# Patient Record
Sex: Female | Born: 1970 | Hispanic: Yes | Marital: Married | Smoking: Never smoker
Health system: Southern US, Community
[De-identification: ages and names within clinical notes are randomized; demographics above are authoritative.]

## PROBLEM LIST (undated history)

## (undated) DIAGNOSIS — D649 Anemia, unspecified: Secondary | ICD-10-CM

## (undated) HISTORY — PX: TUBAL LIGATION: SHX77

---

## 2014-05-22 ENCOUNTER — Ambulatory Visit: Payer: Self-pay | Admitting: Internal Medicine

## 2014-05-22 LAB — COMPREHENSIVE METABOLIC PANEL
Albumin: 3.5 g/dL (ref 3.4–5.0)
Alkaline Phosphatase: 93 U/L
Anion Gap: 8 (ref 7–16)
BILIRUBIN TOTAL: 0.4 mg/dL (ref 0.2–1.0)
BUN: 13 mg/dL (ref 7–18)
CALCIUM: 8.6 mg/dL (ref 8.5–10.1)
Chloride: 105 mmol/L (ref 98–107)
Co2: 27 mmol/L (ref 21–32)
Creatinine: 0.72 mg/dL (ref 0.60–1.30)
EGFR (Non-African Amer.): >60
Glucose: 108 mg/dL — ABNORMAL HIGH (ref 65–99)
Osmolality: 280 (ref 275–301)
POTASSIUM: 3.9 mmol/L (ref 3.5–5.1)
SGOT(AST): 19 U/L (ref 15–37)
SGPT (ALT): 19 U/L
Sodium: 140 mmol/L (ref 136–145)
Total Protein: 7.2 g/dL (ref 6.4–8.2)

## 2014-05-22 LAB — PROTIME-INR
INR: 1
Prothrombin Time: 12.9 secs (ref 11.5–14.7)

## 2014-05-22 LAB — CBC WITH DIFFERENTIAL/PLATELET
BASOS ABS: 0.1 10*3/uL (ref 0.0–0.1)
Basophil %: 0.8 %
EOS ABS: 0.1 10*3/uL (ref 0.0–0.7)
Eosinophil %: 1.4 %
HCT: 31.8 % — ABNORMAL LOW (ref 35.0–47.0)
HGB: 9.7 g/dL — AB (ref 12.0–16.0)
Lymphocyte #: 2.8 10*3/uL (ref 1.0–3.6)
Lymphocyte %: 32.8 %
MCH: 21.8 pg — AB (ref 26.0–34.0)
MCHC: 30.5 g/dL — ABNORMAL LOW (ref 32.0–36.0)
MCV: 72 fL — AB (ref 80–100)
MONO ABS: 0.7 x10 3/mm (ref 0.2–0.9)
Monocyte %: 7.8 %
NEUTROS ABS: 4.9 10*3/uL (ref 1.4–6.5)
NEUTROS PCT: 57.2 %
Platelet: 366 10*3/uL (ref 150–440)
RBC: 4.45 10*6/uL (ref 3.80–5.20)
RDW: 17.2 % — AB (ref 11.5–14.5)
WBC: 8.6 10*3/uL (ref 3.6–11.0)

## 2014-05-22 LAB — APTT: Activated PTT: 26.5 secs (ref 23.6–35.9)

## 2014-05-22 LAB — TSH: Thyroid Stimulating Horm: 2.84 u[IU]/mL

## 2019-03-28 ENCOUNTER — Other Ambulatory Visit: Payer: Self-pay

## 2019-03-28 ENCOUNTER — Emergency Department
Admission: EM | Admit: 2019-03-28 | Discharge: 2019-03-28 | Disposition: A | Discharge status: Home / Self Care | Attending: Emergency Medicine | Admitting: Emergency Medicine

## 2019-03-28 ENCOUNTER — Emergency Department

## 2019-03-28 ENCOUNTER — Encounter: Payer: Self-pay | Admitting: Emergency Medicine

## 2019-03-28 DIAGNOSIS — Y9389 Activity, other specified: Secondary | ICD-10-CM | POA: Diagnosis not present

## 2019-03-28 DIAGNOSIS — Y9289 Other specified places as the place of occurrence of the external cause: Secondary | ICD-10-CM | POA: Insufficient documentation

## 2019-03-28 DIAGNOSIS — W270XXA Contact with workbench tool, initial encounter: Secondary | ICD-10-CM | POA: Diagnosis not present

## 2019-03-28 DIAGNOSIS — S61012A Laceration without foreign body of left thumb without damage to nail, initial encounter: Secondary | ICD-10-CM | POA: Diagnosis present

## 2019-03-28 DIAGNOSIS — S62522A Displaced fracture of distal phalanx of left thumb, initial encounter for closed fracture: Secondary | ICD-10-CM | POA: Insufficient documentation

## 2019-03-28 DIAGNOSIS — Y990 Civilian activity done for income or pay: Secondary | ICD-10-CM | POA: Diagnosis not present

## 2019-03-28 MED ORDER — NAPROXEN 500 MG PO TABS: 500.0000 mg | ORAL_TABLET | Freq: Two times a day (BID) | ORAL | Status: DC

## 2019-03-28 MED ORDER — BACITRACIN-NEOMYCIN-POLYMYXIN 400-5-5000 EX OINT
TOPICAL_OINTMENT | Freq: Once | CUTANEOUS | Status: AC
  Administered 2019-03-28: 1 via TOPICAL
  Filled 2019-03-28: qty 1

## 2019-03-28 MED ORDER — SULFAMETHOXAZOLE-TRIMETHOPRIM 800-160 MG PO TABS: 1.0000 | ORAL_TABLET | Freq: Two times a day (BID) | ORAL | 0 refills | Status: DC

## 2019-03-28 MED ORDER — LIDOCAINE HCL (PF) 1 % IJ SOLN
INTRAMUSCULAR | Status: AC
  Administered 2019-03-28: 5 mL
  Filled 2019-03-28: qty 5

## 2019-03-28 MED ORDER — LIDOCAINE HCL (PF) 1 % IJ SOLN
5.0000 mL | Freq: Once | INTRAMUSCULAR | Status: AC
  Administered 2019-03-28: 09:00:00 5 mL

## 2019-03-28 MED ORDER — OXYCODONE-ACETAMINOPHEN 5-325 MG PO TABS: 1.0000 | ORAL_TABLET | Freq: Once | ORAL | Status: DC

## 2019-03-28 MED ORDER — OXYCODONE-ACETAMINOPHEN 7.5-325 MG PO TABS: 1.0000 | ORAL_TABLET | Freq: Four times a day (QID) | ORAL | 0 refills | Status: DC | PRN

## 2019-03-28 MED ORDER — IBUPROFEN 600 MG PO TABS: 600.0000 mg | ORAL_TABLET | Freq: Once | ORAL | Status: DC

## 2019-03-28 NOTE — ED Triage Notes (Signed)
Pt here with c/o hitting the first digit on her left hand this morning with a hammer while at work at [employer], is workers comp case. NAD.

## 2019-03-28 NOTE — ED Provider Notes (Signed)
[facility] Emergency Department Provider Note   ____________________________________________   None    (approximate)  I have reviewed the triage vital signs and the nursing notes.   HISTORY  Chief Complaint Hand Pain    HPI: Via interpreter Tracy Donovan is a 48 y.o. female patient presents with left thumb pain secondary to contusion.  Patient that she hit her thumb with a hammer while at work at [employer].  Patient denies loss of sensation or loss of function.  Patient is right-hand dominant.  Patient rates the pain as a 5/10.  Patient described the pain is "achy".  Pressure dressing applied prior to arrival.         History reviewed. No pertinent past medical history.  There are no active problems to display for this patient.   History reviewed. No pertinent surgical history.  Prior to Admission medications   Medication Sig Start Date End Date Taking? Authorizing Provider  naproxen (NAPROSYN) 500 MG tablet Take 1 tablet (500 mg total) by mouth 2 (two) times daily with a meal. 03/28/19   Sable Feil, PA-C  oxyCODONE-acetaminophen (PERCOCET) 7.5-325 MG tablet Take 1 tablet by mouth every 6 (six) hours as needed. 03/28/19   Sable Feil, PA-C  sulfamethoxazole-trimethoprim (BACTRIM DS) 800-160 MG tablet Take 1 tablet by mouth 2 (two) times daily. 03/28/19   Sable Feil, PA-C    Allergies Patient has no known allergies.  No family history on file.  Social History Social History   Tobacco Use  . Smoking status: Never Smoker  . Smokeless tobacco: Never Used  Substance Use Topics  . Alcohol use: Not Currently  . Drug use: Not on file    Review of Systems Constitutional: No fever/chills Eyes: No visual changes. ENT: No sore throat. Cardiovascular: Denies chest pain. Respiratory: Denies shortness of breath. Gastrointestinal: No abdominal pain.  No nausea, no vomiting.  No diarrhea.  No constipation. Genitourinary:  Negative for dysuria. Musculoskeletal: Pain edema left thumb Skin: Negative for rash. Neurological: Negative for headaches, focal weakness or numbness.   ____________________________________________   PHYSICAL EXAM:  VITAL SIGNS: ED Triage Vitals  Enc Vitals Group     BP 03/28/19 0819 (!) 188/109     Pulse Rate 03/28/19 0819 69     Resp 03/28/19 0819 16     Temp 03/28/19 0819 99.1 F (37.3 C)     Temp Source 03/28/19 0819 Oral     SpO2 03/28/19 0819 99 %     Weight 03/28/19 0820 170 lb (77.1 kg)     Height 03/28/19 0820 5\' 2"  (1.575 m)     Head Circumference --      Peak Flow --      Pain Score 03/28/19 0820 5     Pain Loc --      Pain Edu? --      Excl. in [place]? --    Constitutional: Alert and oriented. Well appearing and in no acute distress. Cardiovascular: Normal rate, regular rhythm. Grossly normal heart sounds.  Good peripheral circulation.  Elevated blood pressure.  Will recheck before discharge. Respiratory: Normal respiratory effort.  No retractions. Lungs CTAB. Gastrointestinal: Soft and nontender. No distention. No abdominal bruits. No CVA tenderness. Musculoskeletal: Edema but no obvious deformity to the left thumb.   Neurologic:  Normal speech and language. No gross focal neurologic deficits are appreciated. No gait instability. Skin:  Skin is warm, dry and intact. No rash noted.  Superficial laceration lateral aspect  of distal left thumb. Psychiatric: Mood and affect are normal. Speech and behavior are normal.  ____________________________________________   LABS (all labs ordered are listed, but only abnormal results are displayed)  Labs Reviewed - No data to display ____________________________________________  EKG   ____________________________________________  RADIOLOGY  ED MD interpretation:    Official radiology report(s): Dg Finger Thumb Left  Result Date: 03/28/2019 CLINICAL DATA:  Pt hit distal left thumb with hammer this morning at work.  Pain and in thumb. Pt thumb wrapped with bandage to control bleeding. EXAM: LEFT THUMB 2+V COMPARISON:  None. FINDINGS: Comminuted fracture of the distal tuft of first distal phalanx. Overlying soft tissue laceration. No other acute fracture or dislocation. No aggressive osseous lesion. IMPRESSION: Comminuted fracture of the distal tuft of first distal phalanx. Overlying soft tissue laceration. Electronically Signed   By: Elige KoHetal  Patel   On: 03/28/2019 09:27    ____________________________________________   PROCEDURES  Procedure(s) performed (including Critical Care):  Procedures   ____________________________________________   INITIAL IMPRESSION / ASSESSMENT AND PLAN / ED COURSE  As part of my medical decision making, I reviewed the following data within the electronic MEDICAL RECORD NUMBER         Patient presents with left thumb pain, edema and superficial laceration secondary to blunt trauma.  Discussed x-ray findings with patient.  Discussed rationale for not suturing superficial laceration.  Patient wound was clean and bandaged.  Patient placed in a splint.  Patient advised follow orthopedic for definitive evaluation and treatment.  Take medication as directed.  Work restrictions were given.      ____________________________________________   FINAL CLINICAL IMPRESSION(S) / ED DIAGNOSES  Final diagnoses:  Closed fracture of tuft of distal phalanx of left thumb     ED Discharge Orders         Ordered    sulfamethoxazole-trimethoprim (BACTRIM DS) 800-160 MG tablet  2 times daily     03/28/19 0914    oxyCODONE-acetaminophen (PERCOCET) 7.5-325 MG tablet  Every 6 hours PRN     03/28/19 0914    naproxen (NAPROSYN) 500 MG tablet  2 times daily with meals     03/28/19 0915           Note:  This document was prepared using Dragon voice recognition software and may include unintentional dictation errors.    Joni ReiningSmith, Jadesola Poynter K, PA-C 03/28/19 [id number]    Minna AntisPaduchowski, Kevin, MD  03/29/19 2228

## 2019-03-28 NOTE — ED Notes (Signed)
See triage note  Presents with pain to left thumb  States she hit it with a hammer at work  Injury to nail

## 2019-04-05 ENCOUNTER — Encounter: Payer: Self-pay | Admitting: Emergency Medicine

## 2019-04-05 ENCOUNTER — Emergency Department: Payer: No Typology Code available for payment source

## 2019-04-05 ENCOUNTER — Emergency Department
Admission: EM | Admit: 2019-04-05 | Discharge: 2019-04-05 | Disposition: A | Discharge status: Home / Self Care | Payer: No Typology Code available for payment source | Attending: Emergency Medicine | Admitting: Emergency Medicine

## 2019-04-05 DIAGNOSIS — Y939 Activity, unspecified: Secondary | ICD-10-CM | POA: Diagnosis not present

## 2019-04-05 DIAGNOSIS — Y92410 Unspecified street and highway as the place of occurrence of the external cause: Secondary | ICD-10-CM | POA: Insufficient documentation

## 2019-04-05 DIAGNOSIS — S40011A Contusion of right shoulder, initial encounter: Secondary | ICD-10-CM | POA: Diagnosis not present

## 2019-04-05 DIAGNOSIS — Y999 Unspecified external cause status: Secondary | ICD-10-CM | POA: Insufficient documentation

## 2019-04-05 DIAGNOSIS — S4991XA Unspecified injury of right shoulder and upper arm, initial encounter: Secondary | ICD-10-CM | POA: Diagnosis present

## 2019-04-05 DIAGNOSIS — M7918 Myalgia, other site: Secondary | ICD-10-CM

## 2019-04-05 MED ORDER — CYCLOBENZAPRINE HCL 10 MG PO TABS
10.0000 mg | ORAL_TABLET | Freq: Once | ORAL | Status: AC
  Administered 2019-04-05: 10 mg via ORAL
  Filled 2019-04-05: qty 1

## 2019-04-05 MED ORDER — CYCLOBENZAPRINE HCL 10 MG PO TABS: 10.0000 mg | ORAL_TABLET | Freq: Three times a day (TID) | ORAL | 0 refills | Status: DC | PRN

## 2019-04-05 MED ORDER — NAPROXEN 500 MG PO TABS
500.0000 mg | ORAL_TABLET | Freq: Once | ORAL | Status: AC
  Administered 2019-04-05: 500 mg via ORAL
  Filled 2019-04-05: qty 1

## 2019-04-05 MED ORDER — NAPROXEN 500 MG PO TABS: 500.0000 mg | ORAL_TABLET | Freq: Two times a day (BID) | ORAL | 0 refills | Status: DC
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
## 2019-07-10 ENCOUNTER — Ambulatory Visit: Admitting: Occupational Therapy

## 2019-11-19 ENCOUNTER — Emergency Department: Payer: Worker's Compensation

## 2019-11-19 ENCOUNTER — Emergency Department
Admission: EM | Admit: 2019-11-19 | Discharge: 2019-11-19 | Disposition: A | Discharge status: Home / Self Care | Payer: Worker's Compensation | Attending: Emergency Medicine | Admitting: Emergency Medicine

## 2019-11-19 ENCOUNTER — Encounter: Payer: Self-pay | Admitting: Emergency Medicine

## 2019-11-19 ENCOUNTER — Other Ambulatory Visit: Payer: Self-pay

## 2019-11-19 DIAGNOSIS — Y9389 Activity, other specified: Secondary | ICD-10-CM | POA: Insufficient documentation

## 2019-11-19 DIAGNOSIS — Z23 Encounter for immunization: Secondary | ICD-10-CM | POA: Insufficient documentation

## 2019-11-19 DIAGNOSIS — W231XXA Caught, crushed, jammed, or pinched between stationary objects, initial encounter: Secondary | ICD-10-CM | POA: Insufficient documentation

## 2019-11-19 DIAGNOSIS — Y990 Civilian activity done for income or pay: Secondary | ICD-10-CM | POA: Insufficient documentation

## 2019-11-19 DIAGNOSIS — Y9289 Other specified places as the place of occurrence of the external cause: Secondary | ICD-10-CM | POA: Insufficient documentation

## 2019-11-19 DIAGNOSIS — S61210A Laceration without foreign body of right index finger without damage to nail, initial encounter: Secondary | ICD-10-CM | POA: Diagnosis not present

## 2019-11-19 DIAGNOSIS — S6991XA Unspecified injury of right wrist, hand and finger(s), initial encounter: Secondary | ICD-10-CM | POA: Diagnosis present

## 2019-11-19 HISTORY — DX: Anemia, unspecified: D64.9

## 2019-11-19 MED ORDER — LIDOCAINE HCL (PF) 1 % IJ SOLN
5.0000 mL | Freq: Once | INTRAMUSCULAR | Status: AC
  Administered 2019-11-19: 5 mL
  Filled 2019-11-19: qty 5

## 2019-11-19 MED ORDER — TRAMADOL HCL 50 MG PO TABS: 50.0000 mg | ORAL_TABLET | Freq: Four times a day (QID) | ORAL | 0 refills | Status: AC | PRN

## 2019-11-19 MED ORDER — TRAMADOL HCL 50 MG PO TABS
50.0000 mg | ORAL_TABLET | Freq: Once | ORAL | Status: AC
  Administered 2019-11-19: 50 mg via ORAL
  Filled 2019-11-19: qty 1

## 2019-11-19 MED ORDER — IBUPROFEN 600 MG PO TABS: 600.0000 mg | ORAL_TABLET | Freq: Three times a day (TID) | ORAL | 0 refills | Status: DC | PRN

## 2019-11-19 MED ORDER — SULFAMETHOXAZOLE-TRIMETHOPRIM 800-160 MG PO TABS: 1.0000 | ORAL_TABLET | Freq: Two times a day (BID) | ORAL | 0 refills | Status: AC

## 2019-11-19 MED ORDER — BACITRACIN-NEOMYCIN-POLYMYXIN 400-5-5000 EX OINT
TOPICAL_OINTMENT | Freq: Once | CUTANEOUS | Status: AC
  Administered 2019-11-19: 1 via TOPICAL
  Filled 2019-11-19: qty 1

## 2019-11-19 MED ORDER — TETANUS-DIPHTH-ACELL PERTUSSIS 5-2.5-18.5 LF-MCG/0.5 IM SUSP
0.5000 mL | Freq: Once | INTRAMUSCULAR | Status: AC
  Administered 2019-11-19: 0.5 mL via INTRAMUSCULAR
  Filled 2019-11-19: qty 0.5

## 2019-11-19 NOTE — ED Triage Notes (Signed)
Pt presents to ED via POV from work, states was at work at [employer] when she had a crush type injury to top of her hand. Pt presents to ED A&O x 4, bandages in place to R index finger.

## 2019-11-19 NOTE — ED Notes (Signed)
Interpreter request has been placed @1220 

## 2019-11-19 NOTE — ED Notes (Signed)
Presents from [employer] with injury to right index finger  She also states that her left hand has slight injury to index and 5 th finger

## 2019-11-19 NOTE — ED Provider Notes (Signed)
[facility] Emergency Department Provider Note   ____________________________________________   None    (approximate)  I have reviewed the triage vital signs and the nursing notes.   HISTORY  Chief Complaint Finger Injury    HPI Via interpreter Tracy Donovan is a 49 y.o. female patient presents with pain to the right index finger secondary to a crush incident that happened at work prior to arrival.  Patient also states there is a laceration associated with his injury.  Patient tetanus shot is not up-to-date.  Patient is right-hand dominant.  Patient rates pain as a 5/10.  Patient scribed pain is "sore".  Patient denies loss of sensation.         Past Medical History:  Diagnosis Date   Anemia     There are no problems to display for this patient.   Past Surgical History:  Procedure Laterality Date   TUBAL LIGATION      Prior to Admission medications   Medication Sig Start Date End Date Taking? Authorizing Provider  ibuprofen (ADVIL) 600 MG tablet Take 1 tablet (600 mg total) by mouth every 8 (eight) hours as needed. 11/19/19   Joni Reining, PA-C  sulfamethoxazole-trimethoprim (BACTRIM DS) 800-160 MG tablet Take 1 tablet by mouth 2 (two) times daily. 11/19/19   Joni Reining, PA-C  traMADol (ULTRAM) 50 MG tablet Take 1 tablet (50 mg total) by mouth every 6 (six) hours as needed. 11/19/19 11/18/20  Joni Reining, PA-C    Allergies Patient has no known allergies.  No family history on file.  Social History Social History   Tobacco Use   Smoking status: Never Smoker   Smokeless tobacco: Never Used  Substance Use Topics   Alcohol use: Not Currently   Drug use: Not on file    Review of Systems Constitutional: No fever/chills Eyes: No visual changes. ENT: No sore throat. Cardiovascular: Denies chest pain. Respiratory: Denies shortness of breath. Gastrointestinal: No abdominal pain.  No nausea, no vomiting.  No  diarrhea.  No constipation. Genitourinary: Negative for dysuria. Musculoskeletal: Negative for back pain. Skin: Negative for rash. Neurological: Negative for headaches, focal weakness or numbness. Hematological/Lymphatic:  Anemia ____________________________________________   PHYSICAL EXAM:  VITAL SIGNS: ED Triage Vitals [11/19/19 1212]  Enc Vitals Group     BP (!) 169/98     Pulse Rate 75     Resp 20     Temp 99.1 F (37.3 C)     Temp Source Oral     SpO2 100 %     Weight 170 lb (77.1 kg)     Height 5' (1.524 m)     Head Circumference      Peak Flow      Pain Score 5     Pain Loc      Pain Edu?      Excl. in [place]?    Constitutional: Alert and oriented. Well appearing and in no acute distress. Cardiovascular: Normal rate, regular rhythm. Grossly normal heart sounds.  Good peripheral circulation. Respiratory: Normal respiratory effort.  No retractions. Lungs CTAB. Gastrointestinal: Soft and nontender. No distention. No abdominal bruits. No CVA tenderness. Musculoskeletal: No deformity but obvious edema to the right index finger.   Neurologic:  Normal speech and language. No gross focal neurologic deficits are appreciated. No gait instability. Skin:  Skin is warm, dry and intact.  1 cm laceration volar aspect of right index finger. Psychiatric: Mood and affect are normal. Speech and behavior are  normal.  ____________________________________________   LABS (all labs ordered are listed, but only abnormal results are displayed)  Labs Reviewed - No data to display ____________________________________________  EKG   ____________________________________________  RADIOLOGY  ED MD interpretation:    Official radiology report(s): DG Finger Index Right  Result Date: 11/19/2019 CLINICAL DATA:  Pain and edema secondary to crush injury. Finger graft crushed between creates at work. EXAM: RIGHT INDEX FINGER 2+V COMPARISON:  None. FINDINGS: There is no evidence of fracture or  dislocation. There is no evidence of arthropathy or other focal bone abnormality. Soft tissue edema is most prominent proximally. No soft tissue air or radiopaque foreign body. IMPRESSION: Soft tissue edema without acute osseous abnormality. Electronically Signed   By: Keith Rake M.D.   On: 11/19/2019 13:30    ____________________________________________   PROCEDURES  Procedure(s) performed (including Critical Care):  Marland KitchenMarland KitchenLaceration Repair  Date/Time: 11/19/2019 2:09 PM Performed by: Sable Feil, PA-C Authorized by: Sable Feil, PA-C   Consent:    Consent obtained:  Verbal   Consent given by:  Patient   Risks discussed:  Infection, pain, poor cosmetic result and need for additional repair Anesthesia (see MAR for exact dosages):    Anesthesia method:  Nerve block   Block needle gauge:  25 G   Block anesthetic:  Lidocaine 1% w/o epi   Block injection procedure:  Anatomic landmarks identified and anatomic landmarks palpated   Block outcome:  Anesthesia achieved Laceration details:    Location:  Finger   Finger location:  R index finger   Length (cm):  1 Repair type:    Repair type:  Simple Pre-procedure details:    Preparation:  Patient was prepped and draped in usual sterile fashion Exploration:    Contaminated: yes   Treatment:    Area cleansed with:  Betadine and saline   Amount of cleaning:  Extensive   Irrigation solution:  Sterile water   Irrigation method:  Syringe   Visualized foreign bodies/material removed: yes   Skin repair:    Repair method:  Sutures   Suture size:  4-0   Suture material:  Nylon   Suture technique:  Simple interrupted   Number of sutures:  4 Approximation:    Approximation:  Close Post-procedure details:    Dressing:  Antibiotic ointment, adhesive bandage and splint for protection   Patient tolerance of procedure:  Tolerated well, no immediate complications     ____________________________________________   INITIAL  IMPRESSION / ASSESSMENT AND PLAN / ED COURSE  As part of my medical decision making, I reviewed the following data within the [facility]      Patient presents with crush injury and laceration to right index finger.  Discussed neck x-ray findings with patient.  See procedure note for wound closure.  Patient given discharge care instruction advised take medication as directed.  Patient advised to return back in 10 days for suture removal.    Tracy Donovan was evaluated in Emergency Department on 11/19/2019 for the symptoms described in the history of present illness. She was evaluated in the context of the global COVID-19 pandemic, which necessitated consideration that the patient might be at risk for infection with the SARS-CoV-2 virus that causes COVID-19. Institutional protocols and algorithms that pertain to the evaluation of patients at risk for COVID-19 are in a state of rapid change based on information released by regulatory bodies including the CDC and federal and state organizations. These policies and algorithms were followed during  the patient's care in the ED.       ____________________________________________   FINAL CLINICAL IMPRESSION(S) / ED DIAGNOSES  Final diagnoses:  Laceration of right index finger without foreign body without damage to nail, initial encounter     ED Discharge Orders         Ordered    traMADol (ULTRAM) 50 MG tablet  Every 6 hours PRN     11/19/19 1406    ibuprofen (ADVIL) 600 MG tablet  Every 8 hours PRN     11/19/19 1406    sulfamethoxazole-trimethoprim (BACTRIM DS) 800-160 MG tablet  2 times daily     11/19/19 1406           Note:  This document was prepared using Dragon voice recognition software and may include unintentional dictation errors.    Joni Reining, PA-C 11/19/19 1411    Chesley Noon, MD 11/20/19 2023

## 2019-11-19 NOTE — Discharge Instructions (Signed)
Follow discharge care instructions.  Wear finger splint for 3 to 5 days.  Take medication as directed.

## 2019-12-03 ENCOUNTER — Emergency Department
Admission: EM | Admit: 2019-12-03 | Discharge: 2019-12-03 | Disposition: A | Discharge status: Home / Self Care | Payer: Worker's Compensation | Attending: Emergency Medicine | Admitting: Emergency Medicine

## 2019-12-03 ENCOUNTER — Other Ambulatory Visit: Payer: Self-pay

## 2019-12-03 ENCOUNTER — Encounter: Payer: Self-pay | Admitting: Emergency Medicine

## 2019-12-03 DIAGNOSIS — X58XXXD Exposure to other specified factors, subsequent encounter: Secondary | ICD-10-CM | POA: Diagnosis not present

## 2019-12-03 DIAGNOSIS — Z4802 Encounter for removal of sutures: Secondary | ICD-10-CM | POA: Diagnosis not present

## 2019-12-03 DIAGNOSIS — S61210D Laceration without foreign body of right index finger without damage to nail, subsequent encounter: Secondary | ICD-10-CM | POA: Diagnosis present

## 2019-12-03 NOTE — ED Triage Notes (Signed)
Here for suture removal.  Has taken abx. No complications. VSS

## 2019-12-03 NOTE — ED Notes (Signed)
See triage note  Here for suture removal from finger  Provider in with pt

## 2019-12-03 NOTE — ED Provider Notes (Signed)
[facility] Emergency Department Provider Note   ____________________________________________   First MD Initiated Contact with Patient 12/03/19 1336     (approximate)  I have reviewed the triage vital signs and the nursing notes.   HISTORY  Chief Complaint Suture / Staple Removal    HPI Tracy Donovan is a 49 y.o. female presents for suture removal.  Patient has finger laceration to the right index finger 10 days ago.  Patient denies loss sensation or loss of function.      Past Medical History:  Diagnosis Date  . Anemia     There are no problems to display for this patient.   Past Surgical History:  Procedure Laterality Date  . TUBAL LIGATION      Prior to Admission medications   Medication Sig Start Date End Date Taking? Authorizing Provider  sulfamethoxazole-trimethoprim (BACTRIM DS) 800-160 MG tablet Take 1 tablet by mouth 2 (two) times daily. 11/19/19   Sable Feil, PA-C  traMADol (ULTRAM) 50 MG tablet Take 1 tablet (50 mg total) by mouth every 6 (six) hours as needed. 11/19/19 11/18/20  Sable Feil, PA-C    Allergies Patient has no known allergies.  History reviewed. No pertinent family history.  Social History Social History   Tobacco Use  . Smoking status: Never Smoker  . Smokeless tobacco: Never Used  Substance Use Topics  . Alcohol use: Not Currently  . Drug use: Not on file    Review of Systems Constitutional: No fever/chills Eyes: No visual changes. ENT: No sore throat. Cardiovascular: Denies chest pain. Respiratory: Denies shortness of breath. Gastrointestinal: No abdominal pain.  No nausea, no vomiting.  No diarrhea.  No constipation. Genitourinary: Negative for dysuria. Musculoskeletal: Negative for back pain. Skin: Negative for rash.  Healing laceration of the right index finger. Neurological: Negative for headaches, focal weakness or  numbness.   ____________________________________________   PHYSICAL EXAM:  VITAL SIGNS: ED Triage Vitals [12/03/19 1332]  Enc Vitals Group     BP      Pulse Rate 85     Resp 16     Temp 98.3 F (36.8 C)     Temp Source Oral     SpO2 98 %     Weight 170 lb (77.1 kg)     Height 5' (1.524 m)     Head Circumference      Peak Flow      Pain Score 0     Pain Loc      Pain Edu?      Excl. in [place]?    Constitutional: Alert and oriented. Well appearing and in no acute distress. Cardiovascular: Normal rate, regular rhythm. Grossly normal heart sounds.  Good peripheral circulation. Respiratory: Normal respiratory effort.  No retractions. Lungs CTAB. Skin:  Skin is warm, dry and intact. No rash noted.  Healing laceration Psychiatric: Mood and affect are normal. Speech and behavior are normal.  ____________________________________________   LABS (all labs ordered are listed, but only abnormal results are displayed)  Labs Reviewed - No data to display ____________________________________________  EKG   ____________________________________________  RADIOLOGY  ED MD interpretation:    Official radiology report(s): No results found.  ____________________________________________   PROCEDURES  Procedure(s) performed (including Critical Care):  .Suture Removal  Date/Time: 12/03/2019 1:51 PM Performed by: Sable Feil, PA-C Authorized by: Sable Feil, PA-C   Consent:    Consent obtained:  Verbal   Consent given by:  Patient   Risks discussed:  Bleeding, pain and wound separation Procedure details:    Wound appearance:  Clean, good wound healing and no signs of infection   Number of sutures removed:  4 Post-procedure details:    Post-removal:  Band-Aid applied   Patient tolerance of procedure:  Tolerated well, no immediate complications     ____________________________________________   INITIAL IMPRESSION / ASSESSMENT AND PLAN / ED COURSE  As part of  my medical decision making, I reviewed the following data within the electronic MEDICAL RECORD NUMBER     Patient presents for suture removal secondary to laceration 10 days ago.  See procedure note.  Patient given discharge care instruction advised follow-up as needed.    Tracy Donovan was evaluated in Emergency Department on 12/03/2019 for the symptoms described in the history of present illness. She was evaluated in the context of the global COVID-19 pandemic, which necessitated consideration that the patient might be at risk for infection with the SARS-CoV-2 virus that causes COVID-19. Institutional protocols and algorithms that pertain to the evaluation of patients at risk for COVID-19 are in a state of rapid change based on information released by regulatory bodies including the CDC and federal and state organizations. These policies and algorithms were followed during the patient's care in the ED.       ____________________________________________   FINAL CLINICAL IMPRESSION(S) / ED DIAGNOSES  Final diagnoses:  Visit for suture removal     ED Discharge Orders    None       Note:  This document was prepared using Dragon voice recognition software and may include unintentional dictation errors.    Joni Reining, PA-C 12/03/19 1352    Emily Filbert, MD 12/03/19 [phone]

## 2019-12-03 NOTE — Discharge Instructions (Addendum)
Follow discharge care instructions. 

## 2021-06-20 IMAGING — CR RIGHT SHOULDER - 2+ VIEW
2 series · 2 of 2 positions shown · non-contrast
Comparison: None.

CLINICAL DATA: Right shoulder pain

EXAM:
RIGHT SHOULDER - 2+ VIEW

[shoulder grashey]
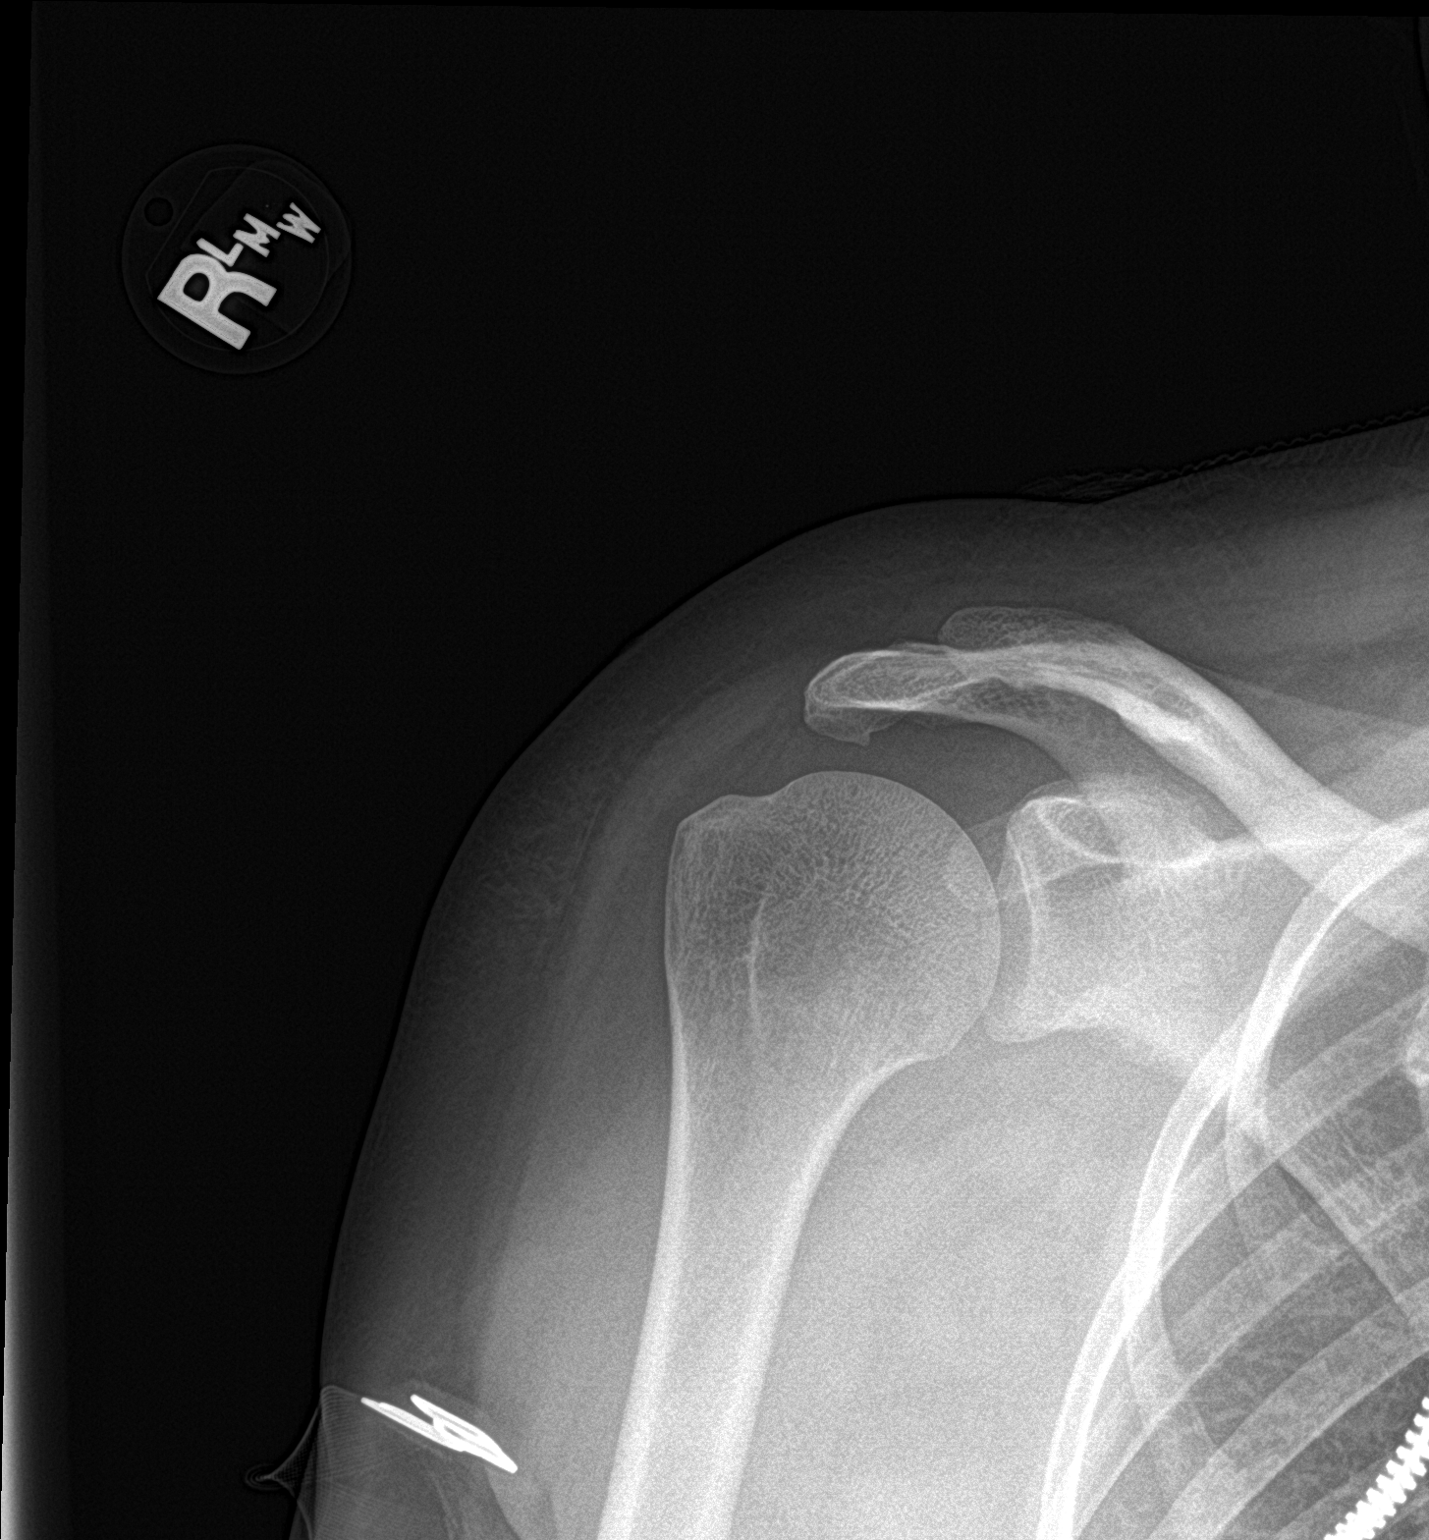

[shoulder y view]
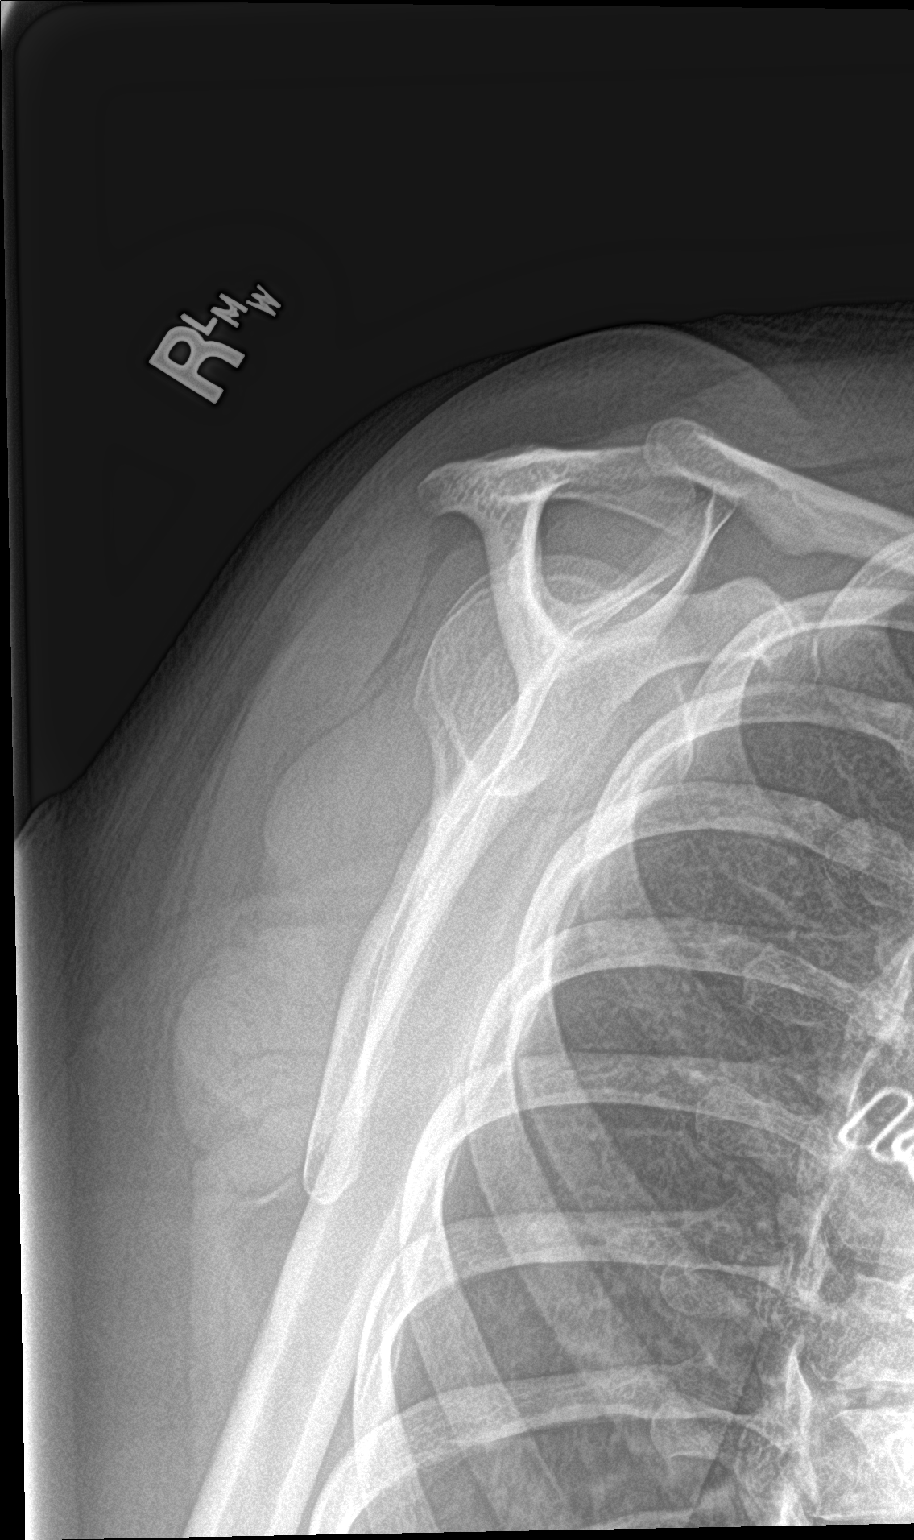

[2 of 2 positions shown; findings below may reference images not displayed]

FINDINGS: There is no evidence of fracture or dislocation. There is no
evidence of arthropathy or other focal bone abnormality. Soft
tissues are unremarkable.
IMPRESSION: Negative.
